# Patient Record
Sex: Male | Born: 1985 | Hispanic: No
Health system: Southern US, Community
[De-identification: ages and names within clinical notes are randomized; demographics above are authoritative.]

---

## 2020-11-20 ENCOUNTER — Ambulatory Visit: Payer: Self-pay | Admitting: Podiatry

## 2020-12-14 ENCOUNTER — Ambulatory Visit (INDEPENDENT_AMBULATORY_CARE_PROVIDER_SITE_OTHER): Payer: 59 | Admitting: Podiatry

## 2020-12-14 ENCOUNTER — Other Ambulatory Visit: Payer: Self-pay

## 2020-12-14 ENCOUNTER — Ambulatory Visit (INDEPENDENT_AMBULATORY_CARE_PROVIDER_SITE_OTHER): Payer: 59

## 2020-12-14 DIAGNOSIS — M722 Plantar fascial fibromatosis: Secondary | ICD-10-CM

## 2020-12-14 DIAGNOSIS — Q666 Other congenital valgus deformities of feet: Secondary | ICD-10-CM

## 2020-12-14 DIAGNOSIS — M779 Enthesopathy, unspecified: Secondary | ICD-10-CM | POA: Diagnosis not present

## 2020-12-14 DIAGNOSIS — S9030XA Contusion of unspecified foot, initial encounter: Secondary | ICD-10-CM

## 2020-12-14 DIAGNOSIS — M79671 Pain in right foot: Secondary | ICD-10-CM

## 2020-12-14 MED ORDER — MELOXICAM 15 MG PO TABS: 15.0000 mg | ORAL_TABLET | Freq: Every day | ORAL | 0 refills | Status: AC

## 2020-12-14 NOTE — Patient Instructions (Signed)

## 2020-12-14 NOTE — Progress Notes (Signed)
Subjective:   Patient ID: Guy Hill, male   DOB: 35 y.o.   MRN: [id number]   HPI 35 year old male presents the office today with concerns of right arch pain which is been ongoing for last 2 months. He is also has some chronic pain to the heel as well as the ankle. 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 He felt that his seat needs to be adjusted for when he works and once he did this he did help his foot and ankle. He denies recent injury or trauma. No significant swelling. No rating pain or weakness. No other treatment. No other concerns.   ROS  No past medical history on file.    Current Outpatient Medications:  .  sildenafil (VIAGRA) 50 MG tablet, Take by mouth., Disp: , Rfl:   No Known Allergies       Objective:  Physical Exam  General: AAO x3, NAD  Dermatological: Skin is warm, dry and supple bilateral. There are no open sores, no preulcerative lesions, no rash or signs of infection present.  Vascular: Dorsalis Pedis artery and Posterior Tibial artery pedal pulses are 2/4 bilateral with immedate capillary fill time.  There is no pain with calf compression, swelling, warmth, erythema.   Neruologic: Grossly intact via light touch bilateral. Negative Tinel sign bilaterally.  Musculoskeletal: There is a decrease in medial arch upon weightbearing. Mild tenderness palpation along the medial band plantar fascia along the arch of the foot. The plantar fashion appears to be intact. No area of pinpoint tenderness. There is no significant pain to the ankle today and there is no edema, erythema. Muscular strength 5/5 in all groups tested bilateral.  Gait: Unassisted, Nonantalgic.       Assessment:   35 year old male with vibratory, plantar fasciitis; tendinitis     Plan:  -Treatment options discussed including all alternatives, risks, and complications -Etiology of symptoms were discussed -X-rays were obtained and reviewed with the patient. No evidence of acute  fracture identified today. Awaiting radiology report. -We discussed steroid injection today but he seemed as of this we hold off on this. Should meloxicam discussed side effects of medication. Discussed shoe modifications and orthotics. He was measured for orthotics today. Will check insurance coverage prior to ordering these. We discussed wearing more supportive shoes. Stretching, ice exercises daily.  Vivi Barrack DPM

## 2020-12-19 ENCOUNTER — Telehealth: Payer: Self-pay | Admitting: Podiatry

## 2020-12-19 NOTE — Telephone Encounter (Signed)
Called pt to discuss orthotic benefits and they are covered at 80% after 5000.00 deductible (pt has met 13.37 only) I explained that the cost is 438.00 and we can do a payment plan with 100.00 down) He is wanting to think about it and get back with [place].

## 2021-01-18 ENCOUNTER — Ambulatory Visit: Payer: 59 | Admitting: Podiatry

## 2021-10-30 IMAGING — DX DG FOOT COMPLETE 3+V*R*
3 series · 3 of 3 positions shown · non-contrast
Comparison: None.

CLINICAL DATA: Right foot pain for 2 months

EXAM:
RIGHT FOOT COMPLETE - 3+ VIEW

[foot ap]
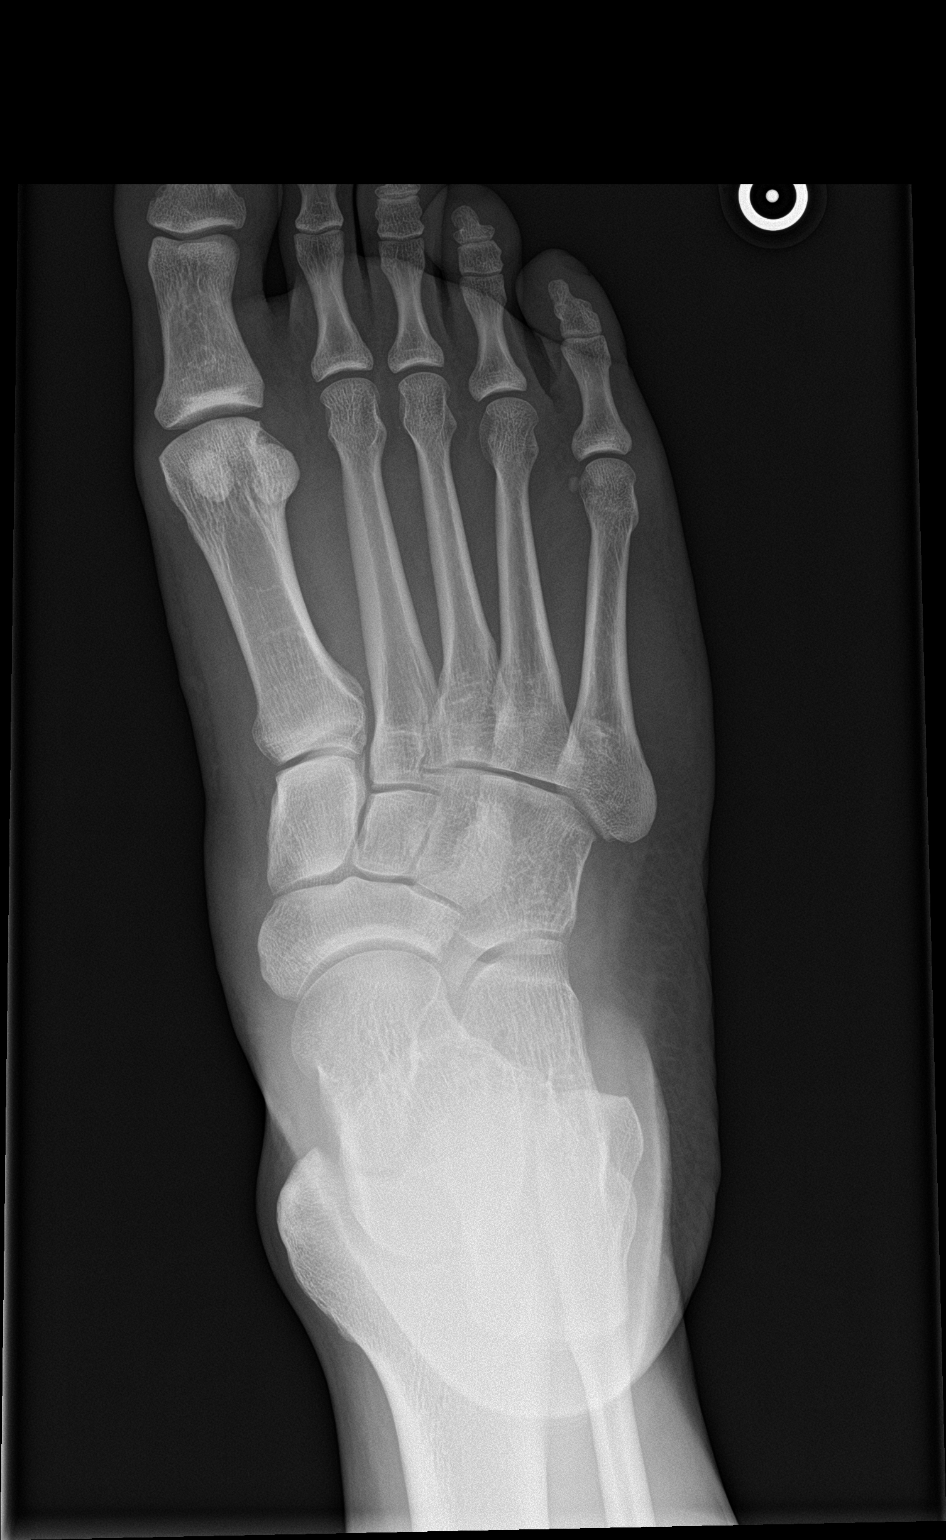

[foot obl]
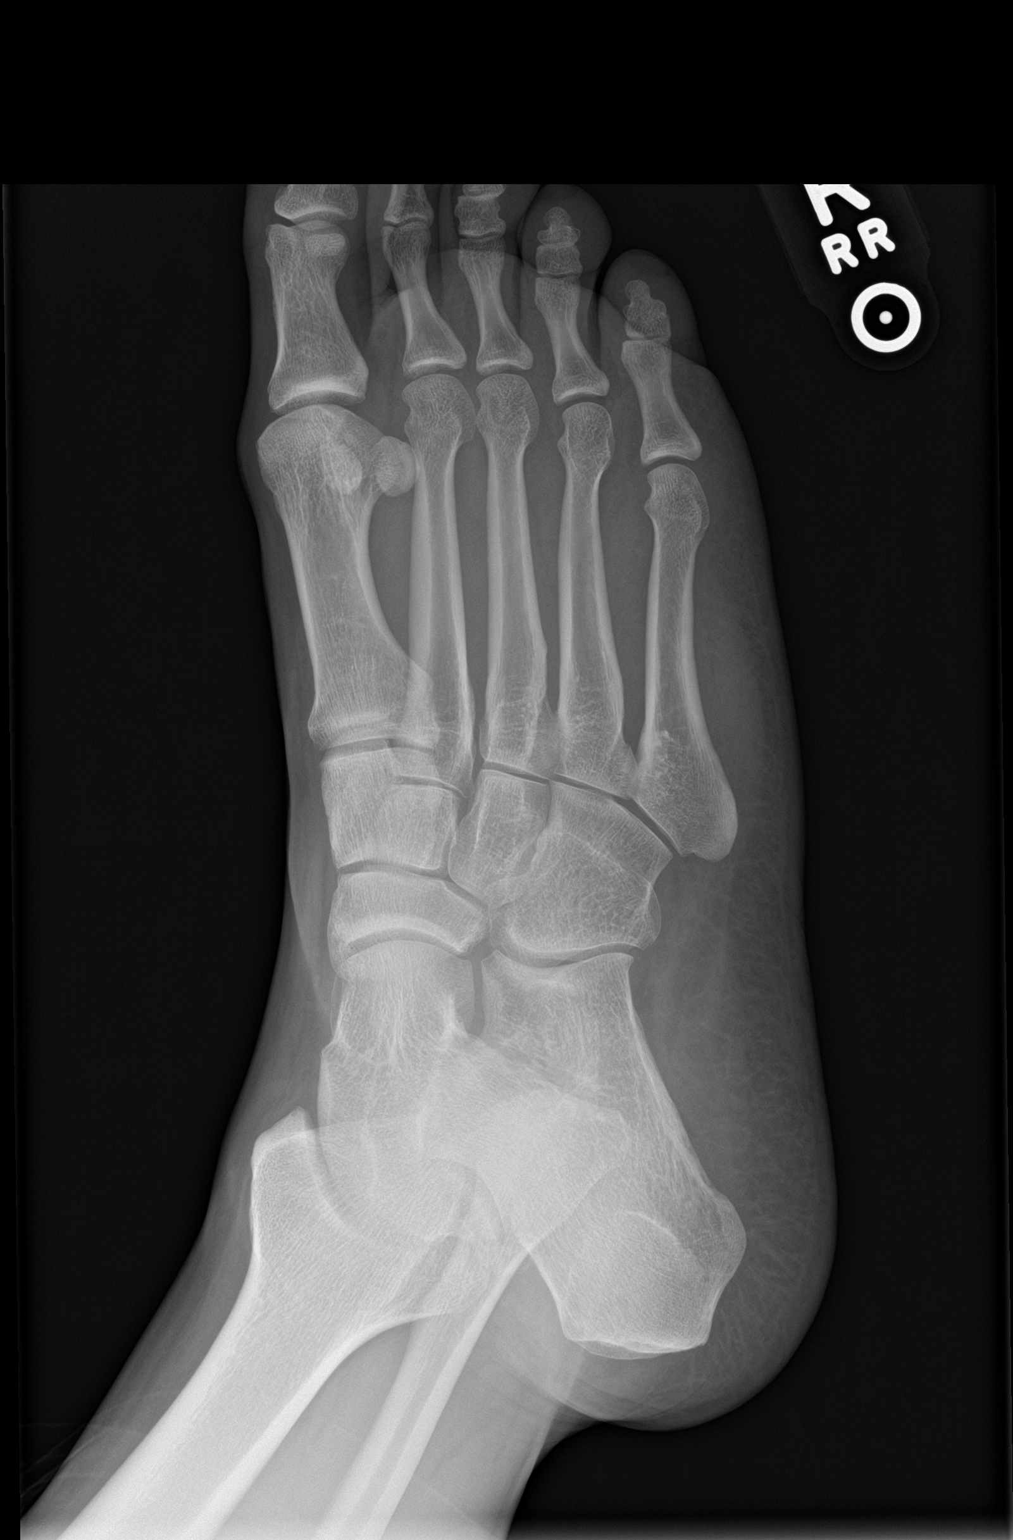

[foot lat]
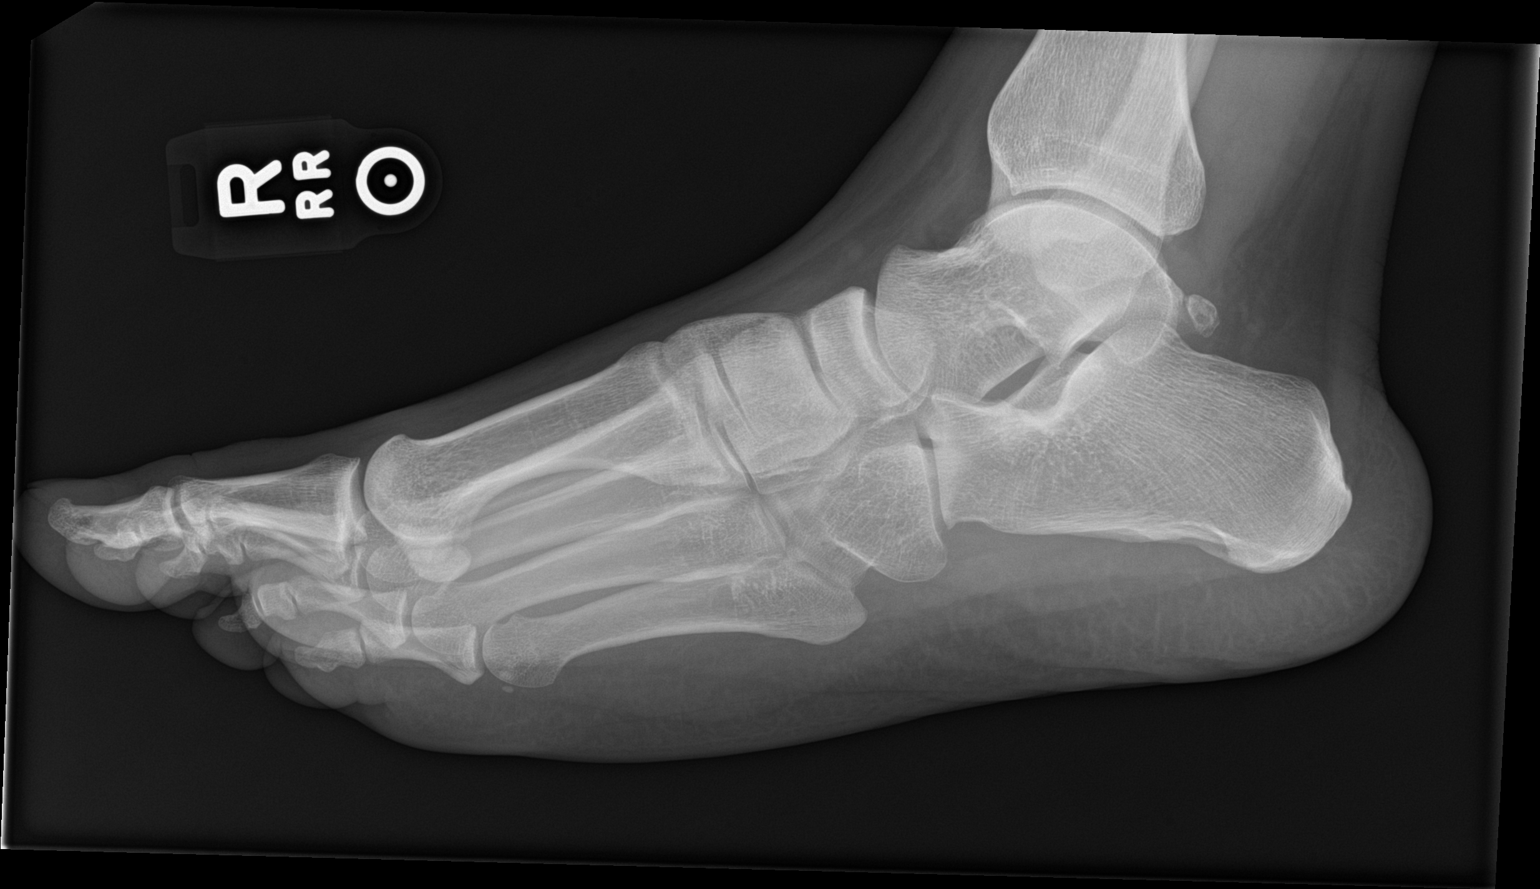

[3 of 3 positions shown; findings below may reference images not displayed]

FINDINGS: There is no evidence of fracture or dislocation. Joint spaces well
maintained. There is no evidence of arthropathy or other focal bone
abnormality. Soft tissues are unremarkable.
IMPRESSION: Negative.
